# Patient Record
Sex: Male | Born: 1975 | Race: Black or African American | Hispanic: No | Marital: Single | Smoking: Current some day smoker
Health system: Southern US, Community
[De-identification: ages and names within clinical notes are randomized; demographics above are authoritative.]

## PROBLEM LIST (undated history)

## (undated) DIAGNOSIS — K859 Acute pancreatitis without necrosis or infection, unspecified: Secondary | ICD-10-CM

---

## 2015-11-26 ENCOUNTER — Emergency Department: Payer: BLUE CROSS/BLUE SHIELD

## 2015-11-26 ENCOUNTER — Encounter: Payer: Self-pay | Admitting: Emergency Medicine

## 2015-11-26 ENCOUNTER — Emergency Department
Admission: EM | Admit: 2015-11-26 | Discharge: 2015-11-26 | Disposition: A | Discharge status: Home / Self Care | Payer: BLUE CROSS/BLUE SHIELD | Attending: Emergency Medicine | Admitting: Emergency Medicine

## 2015-11-26 DIAGNOSIS — K529 Noninfective gastroenteritis and colitis, unspecified: Secondary | ICD-10-CM

## 2015-11-26 DIAGNOSIS — F17200 Nicotine dependence, unspecified, uncomplicated: Secondary | ICD-10-CM | POA: Insufficient documentation

## 2015-11-26 HISTORY — DX: Acute pancreatitis without necrosis or infection, unspecified: K85.90

## 2015-11-26 LAB — CBC
HCT: 41.5 % (ref 40.0–52.0)
Hemoglobin: 14.3 g/dL (ref 13.0–18.0)
MCH: 33.4 pg (ref 26.0–34.0)
MCHC: 34.4 g/dL (ref 32.0–36.0)
MCV: 97.3 fL (ref 80.0–100.0)
PLATELETS: 223 10*3/uL (ref 150–440)
RBC: 4.27 MIL/uL — ABNORMAL LOW (ref 4.40–5.90)
RDW: 12.3 % (ref 11.5–14.5)
WBC: 13.8 10*3/uL — ABNORMAL HIGH (ref 3.8–10.6)

## 2015-11-26 LAB — BASIC METABOLIC PANEL
Anion gap: 5 (ref 5–15)
BUN: 12 mg/dL (ref 6–20)
CALCIUM: 9.4 mg/dL (ref 8.9–10.3)
CHLORIDE: 105 mmol/L (ref 101–111)
CO2: 29 mmol/L (ref 22–32)
CREATININE: 0.87 mg/dL (ref 0.61–1.24)
GFR calc non Af Amer: >60 mL/min (ref >60)
GLUCOSE: 108 mg/dL — AB (ref 65–99)
Potassium: 4.2 mmol/L (ref 3.5–5.1)
Sodium: 139 mmol/L (ref 135–145)

## 2015-11-26 LAB — LIPASE, BLOOD: Lipase: 28 U/L (ref 11–51)

## 2015-11-26 LAB — TROPONIN I: Troponin I: <0.03 ng/mL (ref <0.03)

## 2015-11-26 MED ORDER — ONDANSETRON HCL 4 MG/2ML IJ SOLN
4.0000 mg | Freq: Once | INTRAMUSCULAR | Status: AC
  Administered 2015-11-26: 4 mg via INTRAVENOUS
  Filled 2015-11-26: qty 2

## 2015-11-26 MED ORDER — IOPAMIDOL (ISOVUE-300) INJECTION 61%
30.0000 mL | Freq: Once | INTRAVENOUS | Status: AC | PRN
  Administered 2015-11-26: 30 mL via ORAL

## 2015-11-26 MED ORDER — MORPHINE SULFATE (PF) 2 MG/ML IV SOLN
4.0000 mg | Freq: Once | INTRAVENOUS | Status: AC
  Administered 2015-11-26: 4 mg via INTRAVENOUS
  Filled 2015-11-26: qty 2

## 2015-11-26 MED ORDER — IOPAMIDOL (ISOVUE-300) INJECTION 61%
100.0000 mL | Freq: Once | INTRAVENOUS | Status: AC | PRN
  Administered 2015-11-26: 100 mL via INTRAVENOUS

## 2015-11-26 NOTE — ED Provider Notes (Addendum)
At shift change, I accepted care from Dr. Manson PasseyBrown. Patient was symptoms of enteritis, with an episode of chest discomfort which was felt to be very atypical with reassuring troponin initially, and pending repeat troponin  Repeat troponin is negative, less than 0.03.  Patient will be discharged with Dr. Theora GianottiBrown's prepared discharge instructions.    Governor Rooksebecca Suhail Peloquin, MD 11/26/15 0831  Addended to include, I reprinted patient's discharge instructions to include primary care follow-up.   Governor Rooksebecca Keyla Milone, MD 11/26/15 [phone]

## 2015-11-26 NOTE — ED Triage Notes (Signed)
Patient states that he started having central chest pain yesterday. Patient states he has some shortness of breath and diarrhea that started today. Patient states that about a month ago he was told he had pancreatitis and the pain is the same.

## 2015-11-26 NOTE — ED Notes (Signed)

## 2015-11-26 NOTE — ED Provider Notes (Signed)
[facility] Emergency Department Provider Note   I have reviewed the triage vital signs and the nursing notes.   HISTORY  Chief Complaint Chest Pain; Shortness of Breath; and Diarrhea   HPI Cody Bryant is a 40 y.o. male with recent history of hepatitis presents to the emergency department with abdominal discomfort with radiation to chest times one day accompanied by diarrhea which started today. Patient states his current pain score is 8 out of 10.    Past Medical History:  Diagnosis Date  . Pancreatitis     There are no active problems to display for this patient.   Past surgical history None  Prior to Admission medications   Not on File    Allergies No known drug allergies History reviewed. No pertinent family history.  Social History Social History  Substance Use Topics  . Smoking status: Current Some Day Smoker  . Smokeless tobacco: Never Used  . Alcohol use Yes     Comment: occ    Review of Systems Constitutional: No fever/chills Eyes: No visual changes. ENT: No sore throat. Cardiovascular: Denies chest pain. Respiratory: Denies shortness of breath. Gastrointestinal: Positive for abdominal pain and diarrhea  Genitourinary: Negative for dysuria. Musculoskeletal: Negative for back pain. Skin: Negative for rash. Neurological: Negative for headaches, focal weakness or numbness.  10-point ROS otherwise negative.  ____________________________________________   PHYSICAL EXAM:  VITAL SIGNS: ED Triage Vitals  Enc Vitals Group     BP 11/26/15 0020 123/77     Pulse Rate 11/26/15 0020 74     Resp 11/26/15 0020 18     Temp 11/26/15 0018 98.3 F (36.8 C)     Temp Source 11/26/15 0018 Oral     SpO2 11/26/15 0020 100 %     Weight 11/26/15 0019 200 lb (90.7 kg)     Height 11/26/15 0019 6' (1.829 m)     Head Circumference --      Peak Flow --      Pain Score 11/26/15 0019 7     Pain Loc --      Pain Edu? --      Excl. in GC?  --     Constitutional: Alert and oriented. Well appearing and in no acute distress. Eyes: Conjunctivae are normal. PERRL. EOMI. Head: Atraumatic. Mouth/Throat: Mucous membranes are moist.  Oropharynx non-erythematous. Cardiovascular: Normal rate, regular rhythm. Good peripheral circulation. Grossly normal heart sounds. Respiratory: Normal respiratory effort.  No retractions. Lungs CTAB. Gastrointestinal: Left lower quadrant left upper quadrant epigastric pain with palpation. No distention.  Musculoskeletal: No lower extremity tenderness nor edema. No gross deformities of extremities. Neurologic:  Normal speech and language. No gross focal neurologic deficits are appreciated.  Skin:  Skin is warm, dry and intact. No rash noted. Psychiatric: Mood and affect are normal. Speech and behavior are normal.  ____________________________________________   LABS (all labs ordered are listed, but only abnormal results are displayed)  Labs Reviewed  BASIC METABOLIC PANEL - Abnormal; Notable for the following:       Result Value   Glucose, Bld 108 (*)    All other components within normal limits  CBC - Abnormal; Notable for the following:    WBC 13.8 (*)    RBC 4.27 (*)    All other components within normal limits  TROPONIN I  LIPASE, BLOOD   ____________________________________________  EKG  ED ECG REPORT I, Loretto N Siler Mavis, the attending physician, personally viewed and interpreted this ECG.   Date: 11/26/2015  EKG Time: 12:15 AM  Rate: 71  Rhythm: Normal sinus rhythm  Axis: Normal  Intervals: Normal  ST&T Change: None  ____________________________________________  RADIOLOGY I, Ryder N Kainat Pizana, personally viewed and evaluated these images (plain radiographs) as part of my medical decision making, as well as reviewing the written report by the radiologist.  Dg Chest 2 View  Result Date: 11/26/2015 CLINICAL DATA:  Central chest pain beginning yesterday. EXAM: CHEST  2 VIEW  COMPARISON:  None. FINDINGS: The heart size and mediastinal contours are within normal limits. Both lungs are clear. The visualized skeletal structures are unremarkable. IMPRESSION: No active cardiopulmonary disease. Electronically Signed   By: Gerome Samavid  Williams III M.D   On: 11/26/2015 00:48   Ct Abdomen Pelvis W Contrast  Result Date: 11/26/2015 CLINICAL DATA:  89-year-old male with abdominal pain.  Diarrhea. EXAM: CT ABDOMEN AND PELVIS WITH CONTRAST TECHNIQUE: Multidetector CT imaging of the abdomen and pelvis was performed using the standard protocol following bolus administration of intravenous contrast. CONTRAST:  100mL ISOVUE-300 IOPAMIDOL (ISOVUE-300) INJECTION 61% COMPARISON:  None. FINDINGS: Lower chest: The visualized lung bases are clear. No intra-abdominal free air or free fluid. Hepatobiliary: Diffuse fatty infiltration of the liver. No intrahepatic biliary ductal dilatation. The gallbladder is unremarkable. Pancreas: Unremarkable. No pancreatic ductal dilatation or surrounding inflammatory changes. Spleen: Normal in size without focal abnormality. Adrenals/Urinary Tract: Adrenal glands are unremarkable. Kidneys are normal, without renal calculi, focal lesion, or hydronephrosis. Bladder is unremarkable. Stomach/Bowel: There is mild thickened appearance of jejunal folds in the left upper abdomen likely representing enteritis. There is no evidence of bowel obstruction. Thickened appearance of the distal colon and rectosigmoid likely related to underdistention and less likely colitis. Normal appendix. Vascular/Lymphatic: No significant vascular findings are present. No enlarged abdominal or pelvic lymph nodes. Reproductive: The prostate and seminal vesicles are grossly unremarkable. Other: Small fat containing umbilical hernia. Musculoskeletal: Mild degenerative changes primarily at L5-S1. The osseous structures are otherwise intact. IMPRESSION: Thickened appearance of the jejunal folds most consistent  with enteritis. Clinical correlation is recommended. No bowel obstruction. Normal appendix. Fatty liver. Electronically Signed   By: Elgie CollardArash  Radparvar M.D.   On: 11/26/2015 05:36    :   Procedures      INITIAL IMPRESSION / ASSESSMENT AND PLAN / ED COURSE  Pertinent labs & imaging results that were available during my care of the patient were reviewed by me and considered in my medical decision making (see chart for details).    Clinical Course    ____________________________________________  FINAL CLINICAL IMPRESSION(S) / ED DIAGNOSES  Final diagnoses:  Enteritis     MEDICATIONS GIVEN DURING THIS VISIT:  Medications  morphine 2 MG/ML injection 4 mg (4 mg Intravenous Given 11/26/15 0423)  ondansetron (ZOFRAN) injection 4 mg (4 mg Intravenous Given 11/26/15 0423)  iopamidol (ISOVUE-300) 61 % injection 30 mL (30 mLs Oral Contrast Given 11/26/15 0417)  iopamidol (ISOVUE-300) 61 % injection 100 mL (100 mLs Intravenous Contrast Given 11/26/15 0448)     NEW OUTPATIENT MEDICATIONS STARTED DURING THIS VISIT:  New Prescriptions   No medications on file    Modified Medications   No medications on file    Discontinued Medications   No medications on file     Note:  This document was prepared using Dragon voice recognition software and may include unintentional dictation errors.    Darci Currentandolph N Tome Wilson, MD 11/26/15 [phone]

## 2015-11-26 NOTE — ED Notes (Signed)
Dr. Brown at bedside

## 2015-11-26 NOTE — Discharge Instructions (Signed)
From Dr. Shaune PollackLord:   Return to the emergency department for any worsening symptoms including worsening chest pain, abdominal pain, fever, black or bloody stools, dizziness or passing out, or any other symptoms concerning to you.  For diarrhea you may try over-the-counter Imodium.

## 2016-01-07 ENCOUNTER — Emergency Department: Payer: Worker's Compensation

## 2016-01-07 ENCOUNTER — Emergency Department
Admission: EM | Admit: 2016-01-07 | Discharge: 2016-01-07 | Disposition: A | Discharge status: Home / Self Care | Payer: Worker's Compensation | Attending: Emergency Medicine | Admitting: Emergency Medicine

## 2016-01-07 DIAGNOSIS — Y93F2 Activity, caregiving, lifting: Secondary | ICD-10-CM | POA: Insufficient documentation

## 2016-01-07 DIAGNOSIS — S76212A Strain of adductor muscle, fascia and tendon of left thigh, initial encounter: Secondary | ICD-10-CM

## 2016-01-07 DIAGNOSIS — Y929 Unspecified place or not applicable: Secondary | ICD-10-CM | POA: Insufficient documentation

## 2016-01-07 DIAGNOSIS — X500XXA Overexertion from strenuous movement or load, initial encounter: Secondary | ICD-10-CM | POA: Insufficient documentation

## 2016-01-07 DIAGNOSIS — R1032 Left lower quadrant pain: Secondary | ICD-10-CM

## 2016-01-07 DIAGNOSIS — S3991XA Unspecified injury of abdomen, initial encounter: Secondary | ICD-10-CM | POA: Diagnosis present

## 2016-01-07 DIAGNOSIS — F17200 Nicotine dependence, unspecified, uncomplicated: Secondary | ICD-10-CM | POA: Insufficient documentation

## 2016-01-07 DIAGNOSIS — Y990 Civilian activity done for income or pay: Secondary | ICD-10-CM | POA: Insufficient documentation

## 2016-01-07 MED ORDER — NAPROXEN 500 MG PO TABS: 500.0000 mg | ORAL_TABLET | Freq: Two times a day (BID) | ORAL | 2 refills | Status: AC

## 2016-01-07 MED ORDER — NAPROXEN 500 MG PO TABS
500.0000 mg | ORAL_TABLET | Freq: Once | ORAL | Status: AC
  Administered 2016-01-07: 500 mg via ORAL
  Filled 2016-01-07: qty 1

## 2016-01-07 NOTE — ED Provider Notes (Signed)
[facility] Emergency Department Provider Note   ____________________________________________    I have reviewed the triage vital signs and the nursing notes.   HISTORY  Chief Complaint Groin Pain     HPI Cody Bryant is a 40 y.o. male who presents with complaints of left groin pain. Patient reports he was lifting a heavy object at work and felt a popping in his left groin. He complains of worsening pain when standing and pain in his scrotum as well. He has never had this before. He has not taken anything for it. No abdominal pain. No history of hernia   Past Medical History:  Diagnosis Date  . Pancreatitis     There are no active problems to display for this patient.   History reviewed. No pertinent surgical history.  Prior to Admission medications   Not on File     Allergies Patient has no known allergies.  No family history on file.  Social History Social History  Substance Use Topics  . Smoking status: Current Some Day Smoker  . Smokeless tobacco: Never Used  . Alcohol use Yes     Comment: occ    Review of Systems   Gastrointestinal: No abdominal pain.  No nausea, no vomiting.   Genitourinary: Negative for dysuria.Scrotal discomfort on the left Musculoskeletal: Negative for back pain. Groin pain as above Skin: Negative for rash. Neurological: Negative for weakness    ____________________________________________   PHYSICAL EXAM:  VITAL SIGNS: ED Triage Vitals  Enc Vitals Group     BP 01/07/16 0413 136/88     Pulse Rate 01/07/16 0413 87     Resp 01/07/16 0413 18     Temp 01/07/16 0413 98.3 F (36.8 C)     Temp Source 01/07/16 0413 Oral     SpO2 01/07/16 0413 97 %     Weight 01/07/16 0407 225 lb (102.1 kg)     Height 01/07/16 0407 6' (1.829 m)     Head Circumference --      Peak Flow --      Pain Score 01/07/16 0408 7     Pain Loc --      Pain Edu? --      Excl. in GC? --     Constitutional: Alert and  oriented. No acute distress. Pleasant and interactive Eyes: Conjunctivae are normal.  c. Nose: No congestion/rhinnorhea. Mouth/Throat: Mucous membranes are moist.    Cardiovascular: Normal rate, regular rhythm.  Good peripheral circulation. Respiratory: Normal respiratory effort.  No retractions.  Gastrointestinal: Soft and nontender. No distention.  No CVA tenderness. Genitourinary: Scrotum appears normal, however the patient does have tenderness particularly on the inferior aspect of the testicle. No hernia felt, patient also has tenderness to palpation along the adductor groin muscles Musculoskeletal: No lower extremity tenderness nor edema.  Warm and well perfused Neurologic:  Normal speech and language. No gross focal neurologic deficits are appreciated.  Skin:  Skin is warm, dry and intact. No rash noted. Psychiatric: Mood and affect are normal. Speech and behavior are normal.  ____________________________________________   LABS (all labs ordered are listed, but only abnormal results are displayed)  Labs Reviewed - No data to display ____________________________________________  EKG  None ____________________________________________  RADIOLOGY  Ultrasound pending ____________________________________________   PROCEDURES  Procedure(s) performed: No    Critical Care performed: No ____________________________________________   INITIAL IMPRESSION / ASSESSMENT AND PLAN / ED COURSE  Pertinent labs & imaging results that were available during my care of  the patient were reviewed by me and considered in my medical decision making (see chart for details).  Suspect musculoskeletal injury as the cause of this pain, no hernia felt on thorough exam. We'll obtain ultrasound of the scrotum and reevaluate  Clinical Course   Ultrasound no acute distress. Patient is feeling better. We will treat with NSAIDs for suspected adductor muscle strain, recommend PCP follow-up. Return  precautions discussed ____________________________________________   FINAL CLINICAL IMPRESSION(S) / ED DIAGNOSES  Final diagnoses:  Groin pain, left      NEW MEDICATIONS STARTED DURING THIS VISIT:  New Prescriptions   No medications on file     Note:  This document was prepared using Dragon voice recognition software and may include unintentional dictation errors.    Jene Everyobert Maclean Foister, MD 01/07/16 [phone]

## 2016-01-07 NOTE — ED Notes (Signed)
Pt returned from ultrasound

## 2016-01-07 NOTE — ED Triage Notes (Signed)
Patient was at work, lifted a heavy piece of equipment and felt something pop. Now with swelling in the left groin.

## 2016-01-07 NOTE — ED Notes (Signed)
Pt's supervisor, Marcelle Smilinganny Moore,  arrived to ED with pt; he says pt needs a UDS and breathalyzer test for worker's comp

## 2016-01-09 ENCOUNTER — Ambulatory Visit
Admission: EM | Admit: 2016-01-09 | Discharge: 2016-01-09 | Disposition: A | Discharge status: Home / Self Care | Payer: Worker's Compensation | Attending: Family Medicine | Admitting: Family Medicine

## 2016-01-09 ENCOUNTER — Encounter: Payer: Self-pay | Admitting: Emergency Medicine

## 2016-01-09 DIAGNOSIS — S76212A Strain of adductor muscle, fascia and tendon of left thigh, initial encounter: Secondary | ICD-10-CM | POA: Diagnosis not present

## 2016-01-09 MED ORDER — CYCLOBENZAPRINE HCL 10 MG PO TABS: 10.0000 mg | ORAL_TABLET | Freq: Three times a day (TID) | ORAL | 0 refills | Status: AC | PRN

## 2016-01-09 NOTE — ED Triage Notes (Signed)
Patient here for follow-up visit for recent work related injury.  Patient was seen at [facility] ER 12/10.  Patient reports ongoing swelling and pain on the left side of his groin.

## 2016-01-09 NOTE — ED Provider Notes (Signed)
CSN: [id number]     Arrival date & time 01/09/16  1105 History   First MD Initiated Contact with Patient 01/09/16 1324     Chief Complaint  Patient presents with  . Worker's Comp Follow-up   (Consider location/radiation/quality/duration/timing/severity/associated sxs/prior Treatment) 40 year old male presents for follow-up to a worker's comp injury that occurred on 01/06/16. He was at work and lifting a barrel with another co-worker when he felt a "pop" in his left groin area. He continued to work but starting having increased pain. He reported the incident to his supervisor who took him to [facility] ER early in the AM on 01/07/16. They performed an ultrasound which was negative (no hernia) except for a varicocele. Was given Naproxen which has not helped much with pain and swelling. Was suppose to return to work today but having more pain and swelling in the area. He is concerned that additional injury has occurred. No history of chronic health issues. Takes no other daily medication.     The history is provided by the patient.    Past Medical History:  Diagnosis Date  . Pancreatitis    History reviewed. No pertinent surgical history. History reviewed. No pertinent family history. Social History  Substance Use Topics  . Smoking status: Current Some Day Smoker  . Smokeless tobacco: Never Used  . Alcohol use Yes     Comment: occ    Review of Systems  Constitutional: Negative for chills, fatigue and fever.  Cardiovascular: Negative for chest pain.  Gastrointestinal: Negative for abdominal pain, constipation, diarrhea, nausea and vomiting.  Genitourinary: Negative for difficulty urinating, discharge, dysuria, flank pain, hematuria, penile pain, penile swelling and testicular pain. Scrotal swelling: left sided groin swelling and pain.  Musculoskeletal: Positive for myalgias. Negative for arthralgias, back pain and joint swelling.  Skin: Negative for rash and wound.  Neurological: Negative  for weakness, numbness and headaches.  Hematological: Negative for adenopathy.    Allergies  Patient has no known allergies.  Home Medications   Prior to Admission medications   Medication Sig Start Date End Date Taking? Authorizing Provider  cyclobenzaprine (FLEXERIL) 10 MG tablet Take 1 tablet (10 mg total) by mouth 3 (three) times daily as needed for muscle spasms. 01/09/16   Sudie GrumblingAnn Berry Keron Koffman, NP  naproxen (NAPROSYN) 500 MG tablet Take 1 tablet (500 mg total) by mouth 2 (two) times daily with a meal. 01/07/16   Jene Everyobert Kinner, MD   Meds Ordered and Administered this Visit  Medications - No data to display  BP 138/86 (BP Location: Right Arm)   Pulse 73   Temp 98.6 F (37 C) (Oral)   Resp 17   Ht 6' (1.829 m)   Wt 210 lb (95.3 kg)   SpO2 100%   BMI 28.48 kg/m  No data found.   Physical Exam  Constitutional: He is oriented to person, place, and time. He appears well-developed and well-nourished. No distress.  Cardiovascular: Normal rate, regular rhythm, normal heart sounds and intact distal pulses.   Abdominal: Soft. Normal appearance and bowel sounds are normal. He exhibits no distension. There is tenderness in the left lower quadrant. There is no rebound and no guarding. No hernia. Hernia confirmed negative in the left inguinal area.    Genitourinary: Left testis shows no mass and no tenderness.     Genitourinary Comments: Very tender along left inguinal area. Slight swelling present. No tenderness or swelling on right side. No hernia present. No erythema or rash. Pain increases with bending  leg/hip.   Musculoskeletal: Normal range of motion. He exhibits tenderness.       Left hip: He exhibits tenderness. He exhibits normal range of motion and no swelling.  Lymphadenopathy: No inguinal adenopathy noted on the left side.  Neurological: He is alert and oriented to person, place, and time. He has normal strength. No sensory deficit.  Skin: Skin is warm, dry and intact.  Capillary refill takes less than 2 seconds. No rash noted.  Psychiatric: He has a normal mood and affect. His speech is normal and behavior is normal. Judgment and thought content normal.    Urgent Care Course   Clinical Course     Procedures (including critical care time)  Labs Review Labs Reviewed - No data to display  Imaging Review No results found.   Visual Acuity Review  Right Eye Distance:   Left Eye Distance:   Bilateral Distance:    Right Eye Near:   Left Eye Near:    Bilateral Near:         MDM   1. Groin strain, left, initial encounter    Discussed with patient that he does not have a hernia. Reviewed that he probably has a significant groin strain that will take time to heal. Recommend continue Naproxen 500mg  twice a day as directed. May try Flexeril 10mg  1/2 to 1 tablet 3 times a day as needed for muscle pain/spasms. Will cause drowsiness. May apply warm moist heat to area for comfort. Note written for work for today and tomorrow. No lifting >25 lbs for at least 3 days until cleared by the Occupational Health Provider. Discussed that he should call Tommie Maximiano CossAnne Moore, NP to schedule follow-up appointment for further evaluation and for any additional restrictions and/or notes for work. Patient understands and will call today to make an appointment.     Sudie GrumblingAnn Berry Daziyah Cogan, NP 01/09/16 [phone]

## 2016-01-09 NOTE — Discharge Instructions (Signed)
Recommend continue Naproxen twice a day as needed for pain and swelling. May take Flexeril 10mg  1/2 to 1 tablet up to 3 times a day as needed for muscle pain. Apply warm compresses to area for comfort. Call to schedule follow-up with Occupational Health provider.

## 2018-08-19 IMAGING — CT CT ABD-PELV W/ CM
2 of 5 series · 16 of 46 positions shown, 18 images · IV contrast (APPLIED)
Comparison: None.

CLINICAL DATA: 4-year-old male with abdominal pain.  Diarrhea.

EXAM:
CT ABDOMEN AND PELVIS WITH CONTRAST
TECHNIQUE: Multidetector CT imaging of the abdomen and pelvis was performed
using the standard protocol following bolus administration of
intravenous contrast.
CONTRAST:  100mL DOYT9T-122 IOPAMIDOL (DOYT9T-122) INJECTION 61%

[Series 2: axial st · axial · 0.81mm/px · z∈[-501,-61]mm · 13 of 100 slices shown, 15 images]
[im 6/100  soft-tissue]
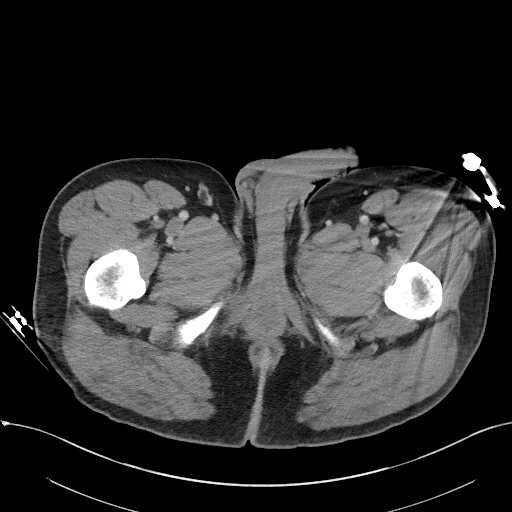
[im 6/100  bone]
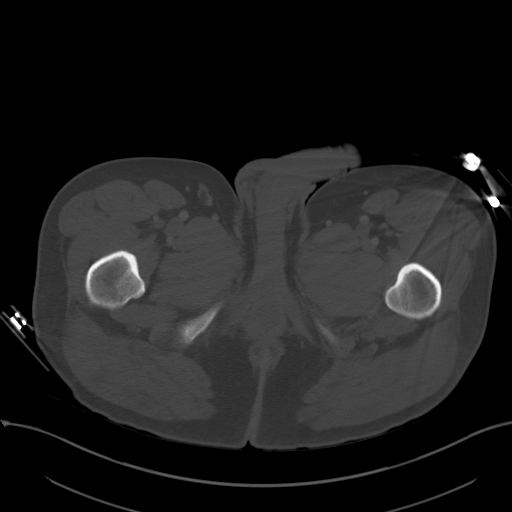
[im 12/100  soft-tissue]
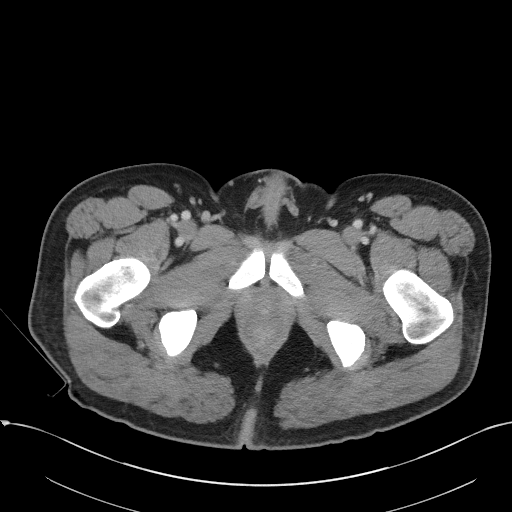
[im 23/100  soft-tissue]
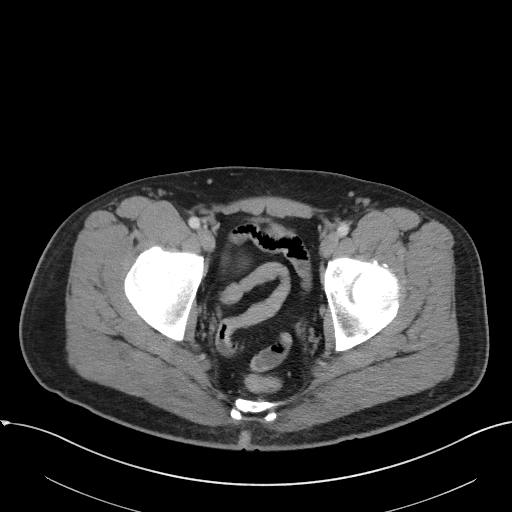
[im 28/100  soft-tissue]
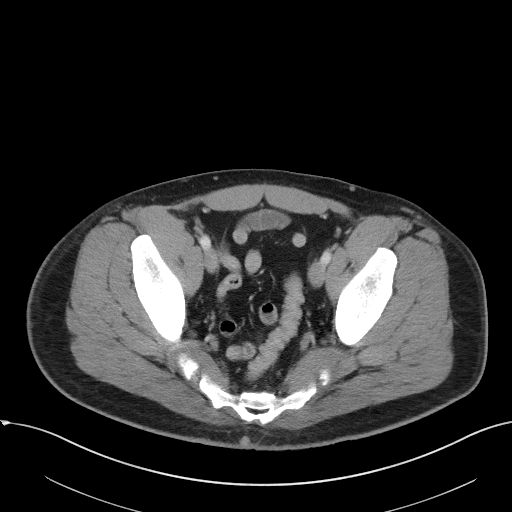
[im 34/100  soft-tissue]
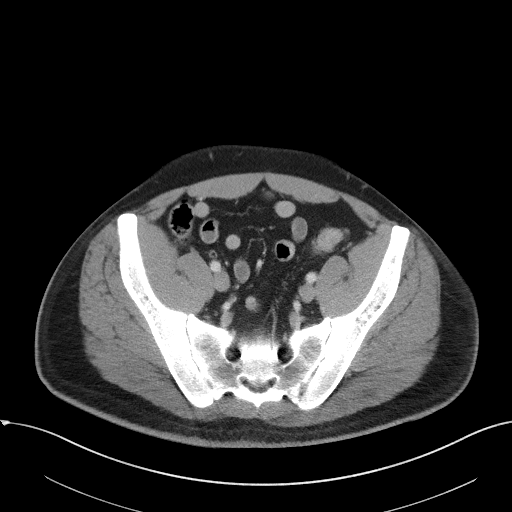
[im 45/100  soft-tissue]
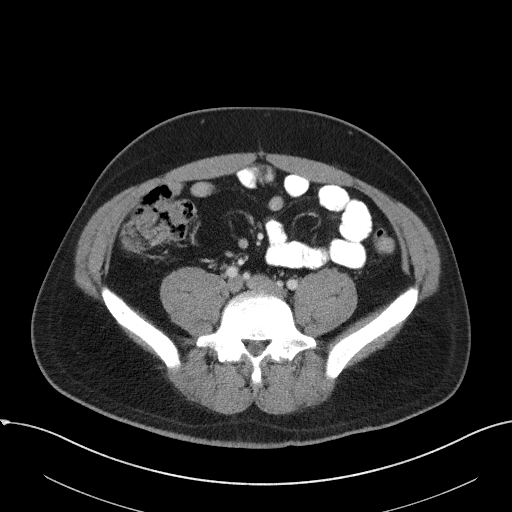
[im 50/100  soft-tissue]
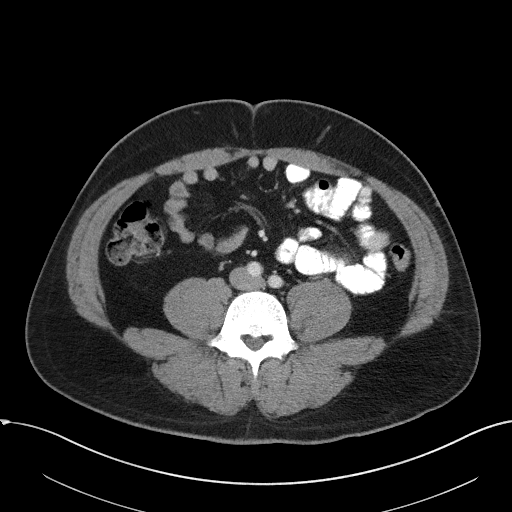
[im 56/100  soft-tissue]
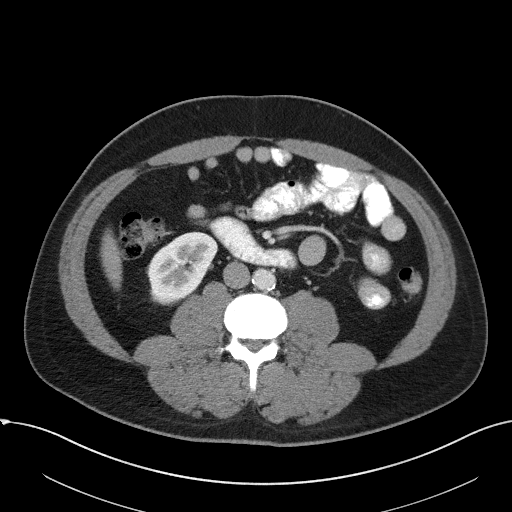
[im 67/100  soft-tissue]
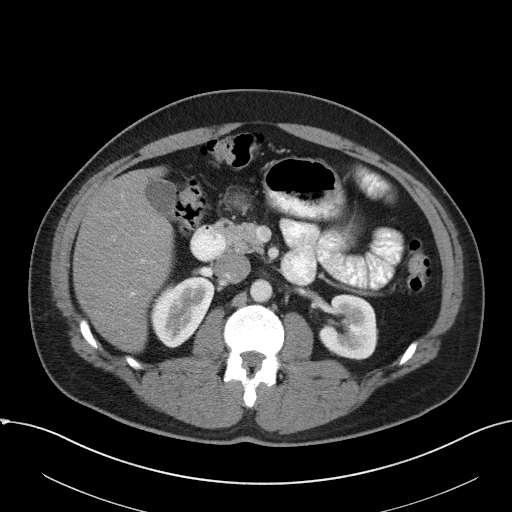
[im 67/100  bone]
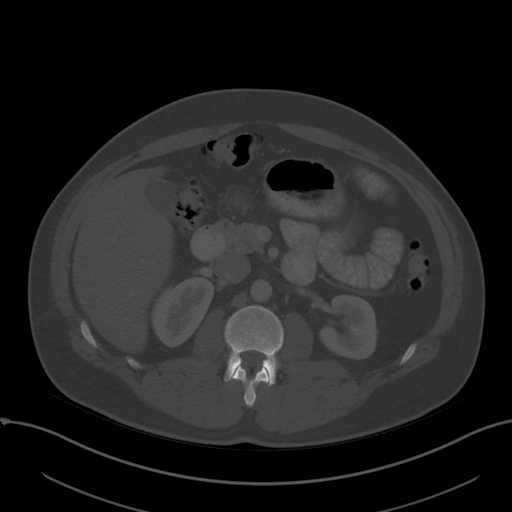
[im 72/100  soft-tissue]
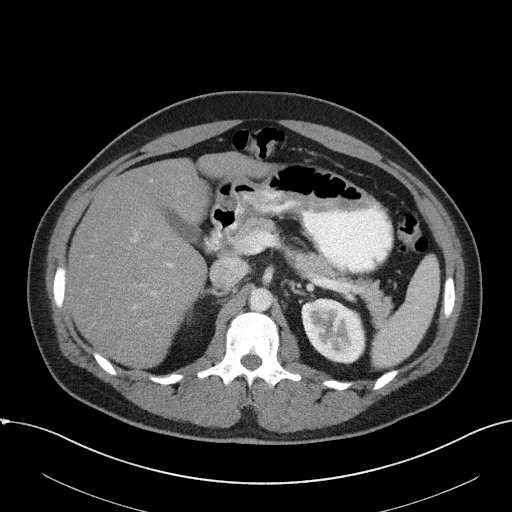
[im 78/100  soft-tissue]
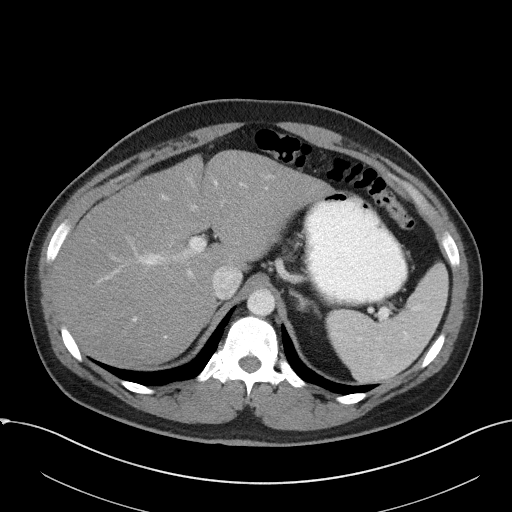
[im 89/100  soft-tissue]
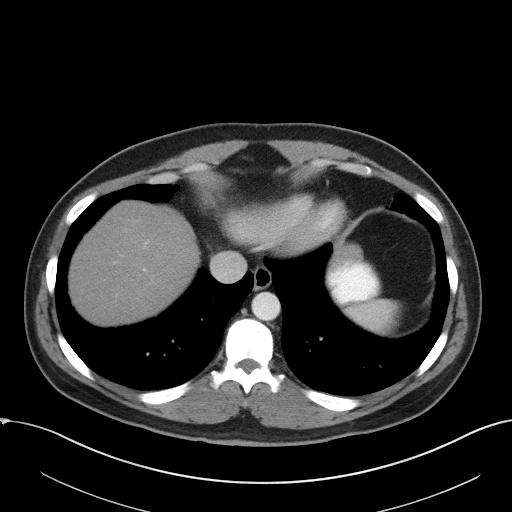
[im 94/100  soft-tissue]
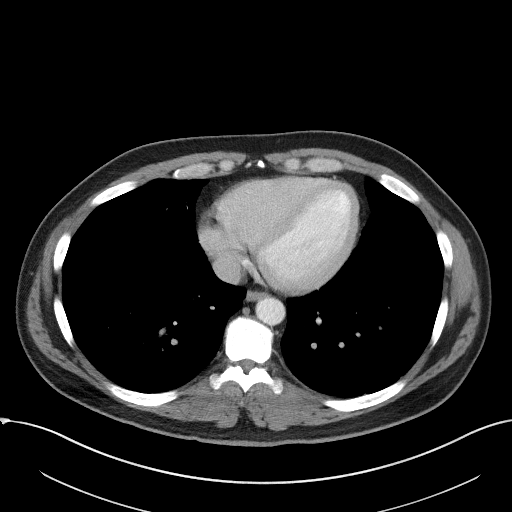

[Series 5: coronal st · coronal · 0.77mm/px · 3 of 91 slices shown]
[im 31/91  soft-tissue]
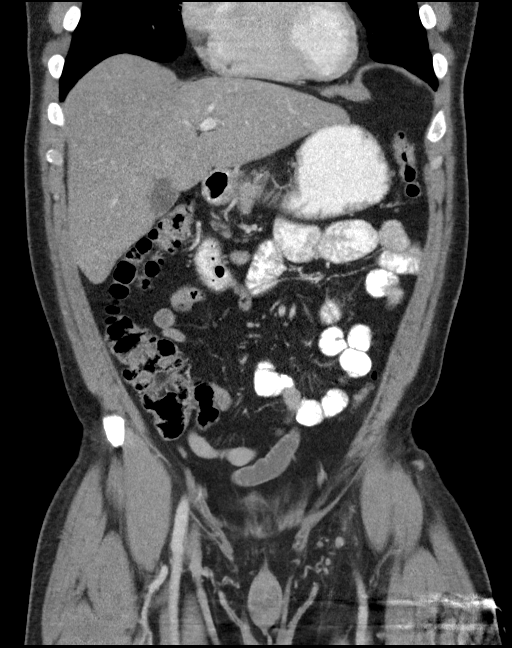
[im 41/91  soft-tissue]
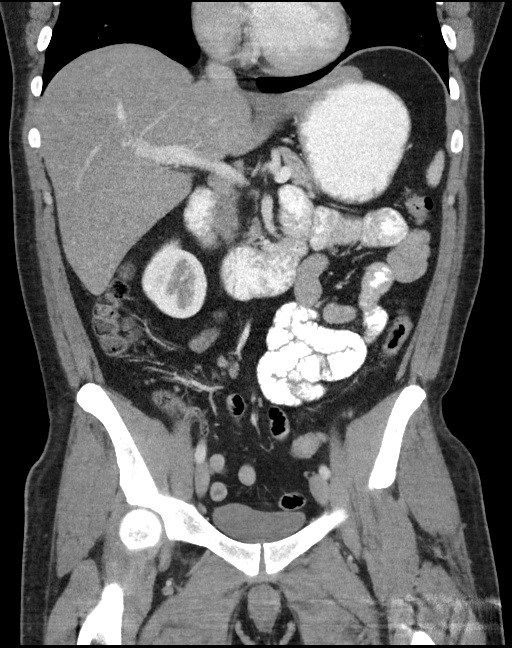
[im 51/91  soft-tissue]
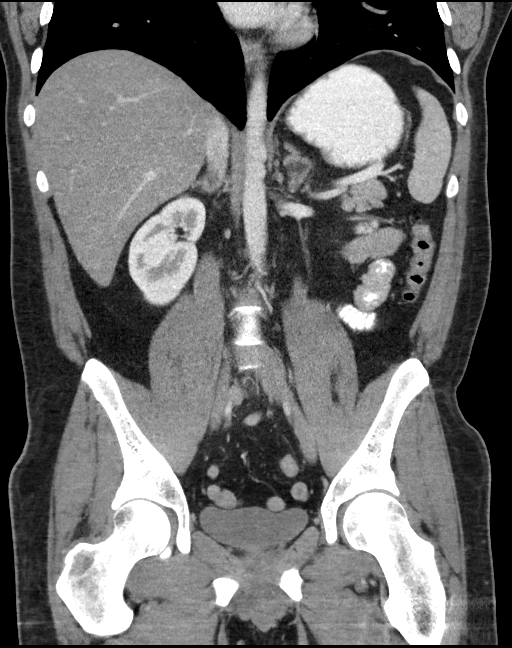

[16 of 46 positions shown; findings below may reference images not displayed]

FINDINGS: Lower chest: The visualized lung bases are clear.

No intra-abdominal free air or free fluid.

Hepatobiliary: Diffuse fatty infiltration of the liver. No
intrahepatic biliary ductal dilatation. The gallbladder is
unremarkable.

Pancreas: Unremarkable. No pancreatic ductal dilatation or
surrounding inflammatory changes.

Spleen: Normal in size without focal abnormality.

Adrenals/Urinary Tract: Adrenal glands are unremarkable. Kidneys are
normal, without renal calculi, focal lesion, or hydronephrosis.
Bladder is unremarkable.

Stomach/Bowel: There is mild thickened appearance of jejunal folds
in the left upper abdomen likely representing enteritis. There is no
evidence of bowel obstruction. Thickened appearance of the distal
colon and rectosigmoid likely related to underdistention and less
likely colitis. Normal appendix.

Vascular/Lymphatic: No significant vascular findings are present. No
enlarged abdominal or pelvic lymph nodes.

Reproductive: The prostate and seminal vesicles are grossly
unremarkable.

Other: Small fat containing umbilical hernia.

Musculoskeletal: Mild degenerative changes primarily at L5-S1. The
osseous structures are otherwise intact.
IMPRESSION: Thickened appearance of the jejunal folds most consistent with
enteritis. Clinical correlation is recommended. No bowel
obstruction. Normal appendix.

Fatty liver.

## 2018-08-19 IMAGING — CR DG CHEST 2V
1 series · 2 of 2 positions shown · non-contrast
Comparison: None.

CLINICAL DATA: Central chest pain beginning yesterday.

EXAM:
CHEST  2 VIEW

[Series 1: dg chest 2 view · 0.14mm/px · 2 of 2 slices shown]
[im 1/2]
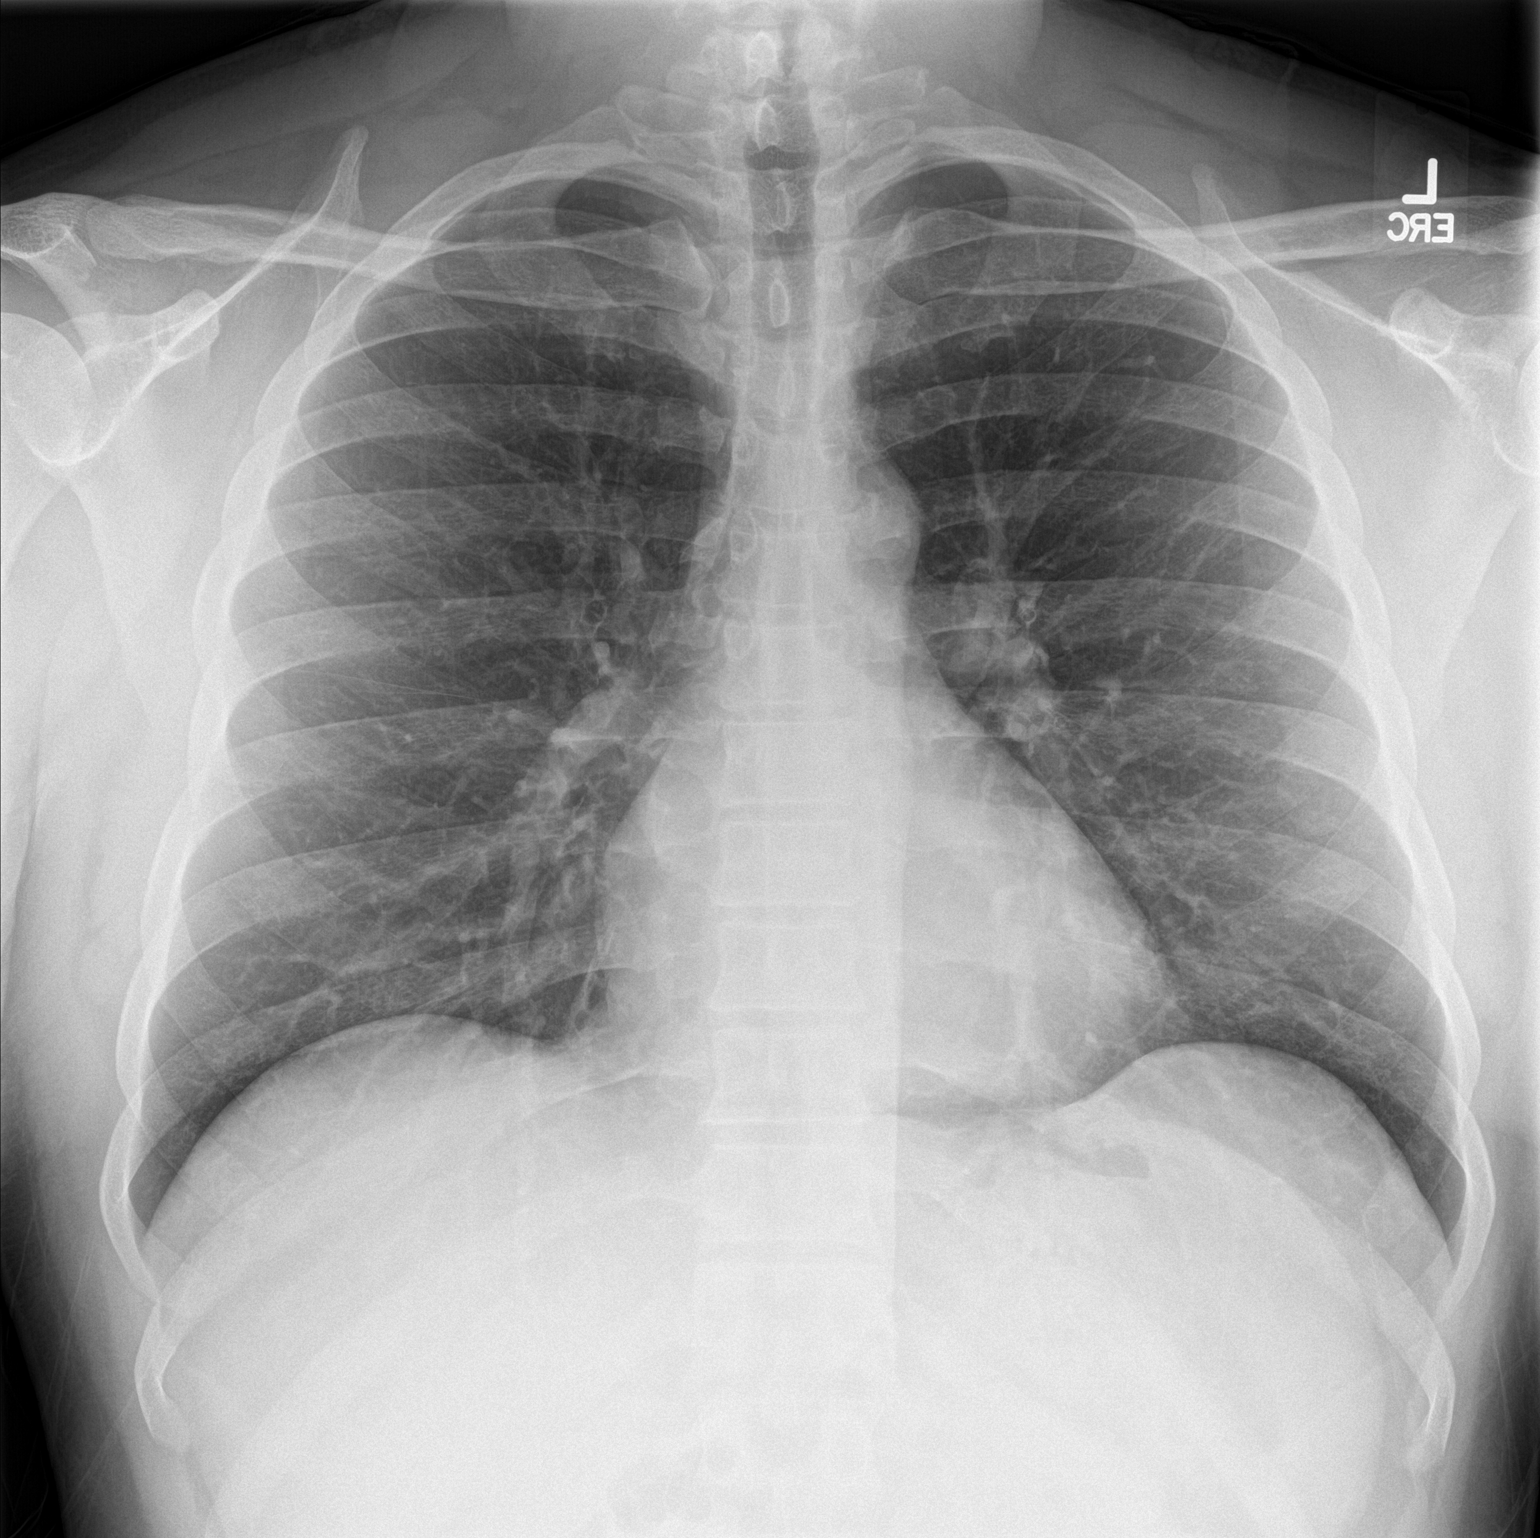
[im 2/2]
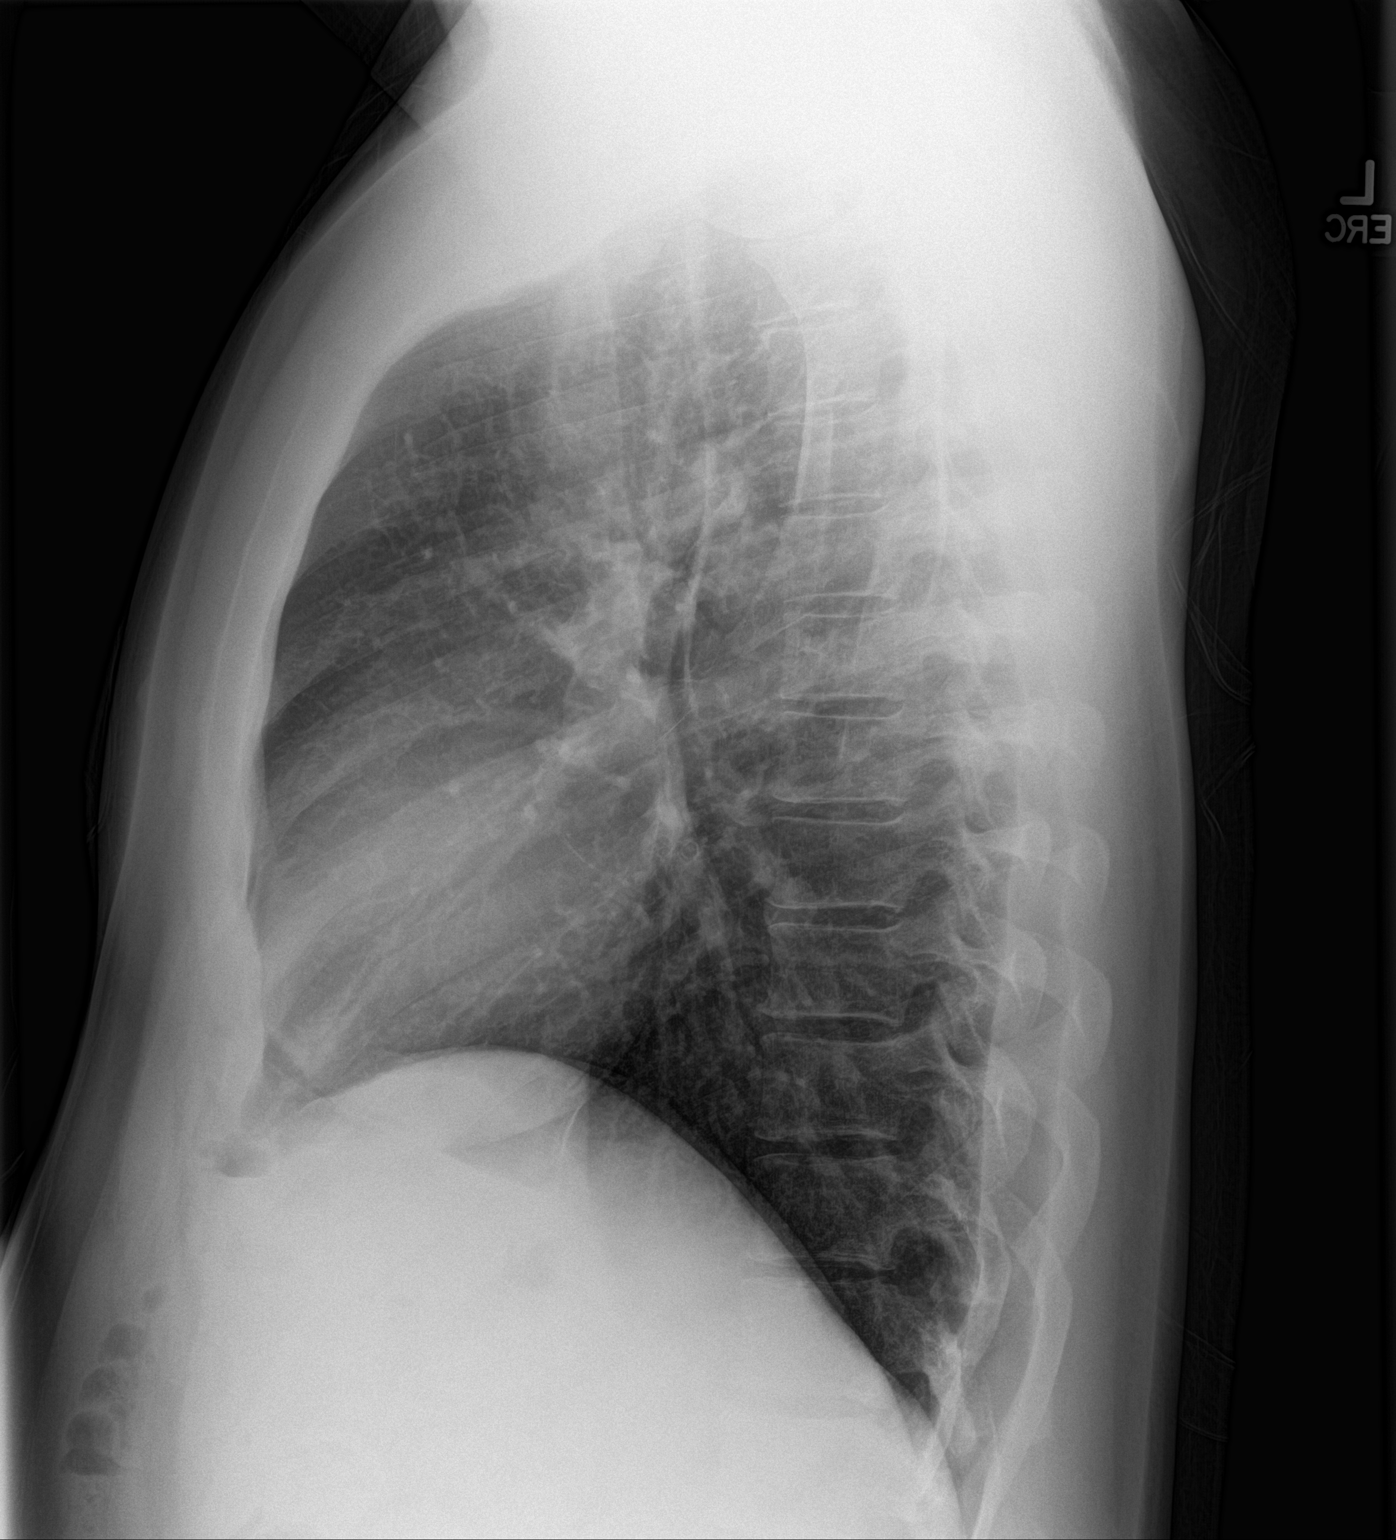

[2 of 2 positions shown; findings below may reference images not displayed]

FINDINGS: The heart size and mediastinal contours are within normal limits.
Both lungs are clear. The visualized skeletal structures are
unremarkable.
IMPRESSION: No active cardiopulmonary disease.

## 2018-11-18 IMAGING — US US SCROTUM
1 series · 14 of 25 positions shown · non-contrast
Comparison: None available.

CLINICAL DATA: Initial evaluation for acute right testicular
swelling for 6 hours.

EXAM:
ULTRASOUND OF SCROTUM
TECHNIQUE: Complete ultrasound examination of the testicles, epididymis, and
other scrotal structures was performed.

[Series 1: us scrotum · 0.08mm/px · 14 of 56 slices shown]
[im 1/56]
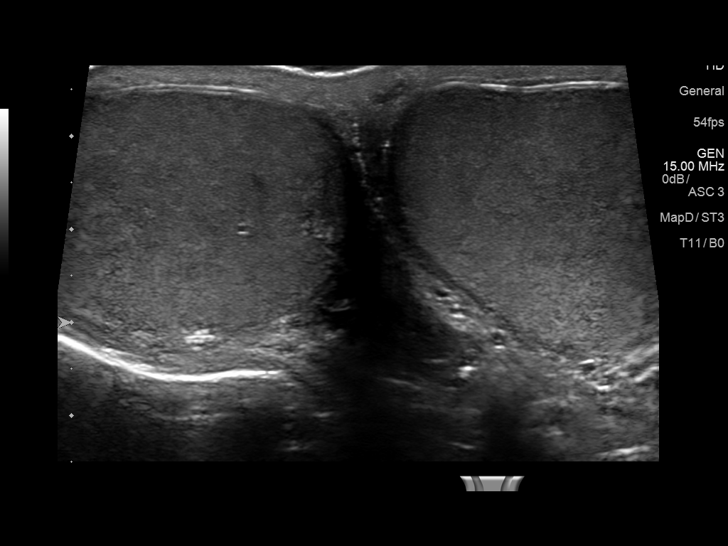
[im 5/56]
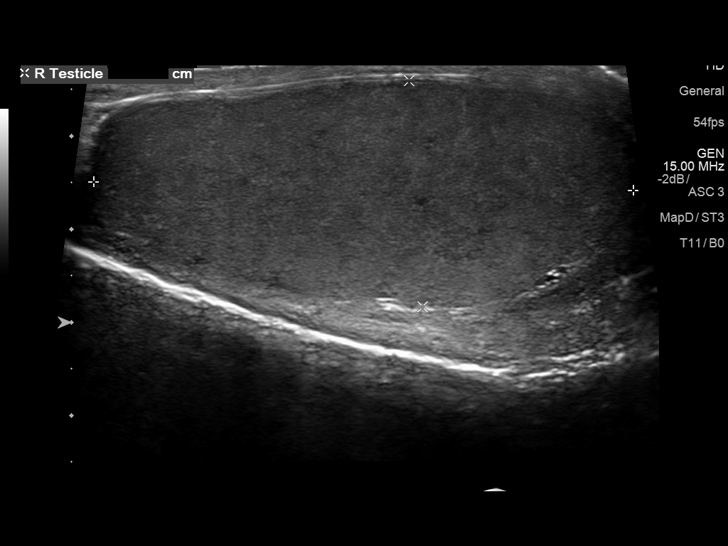
[im 10/56]
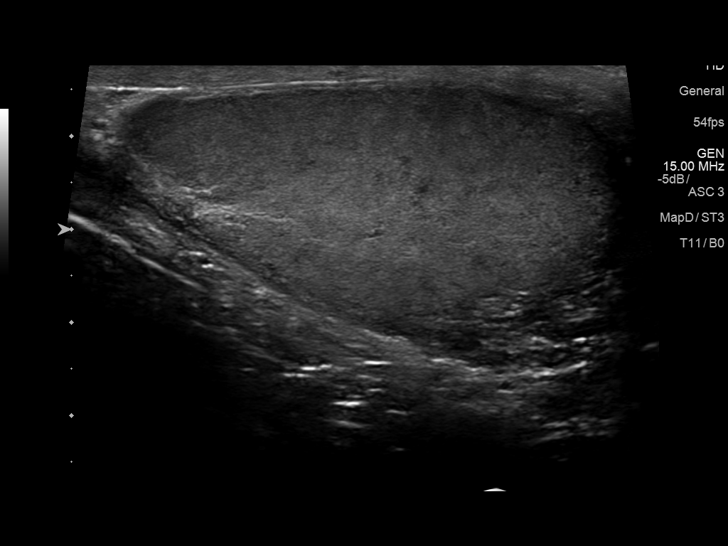
[im 14/56]
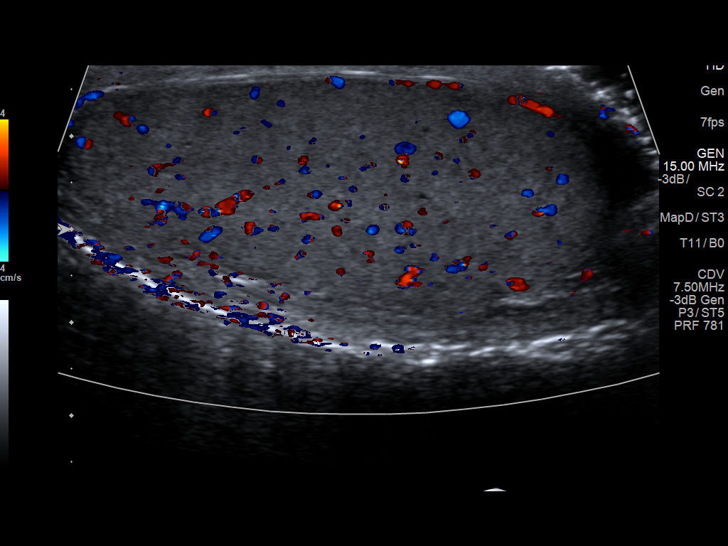
[im 19/56]
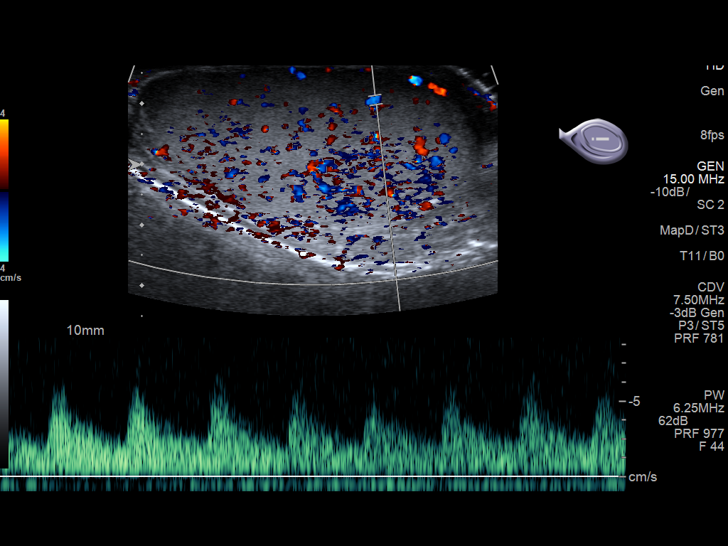
[im 21/56]
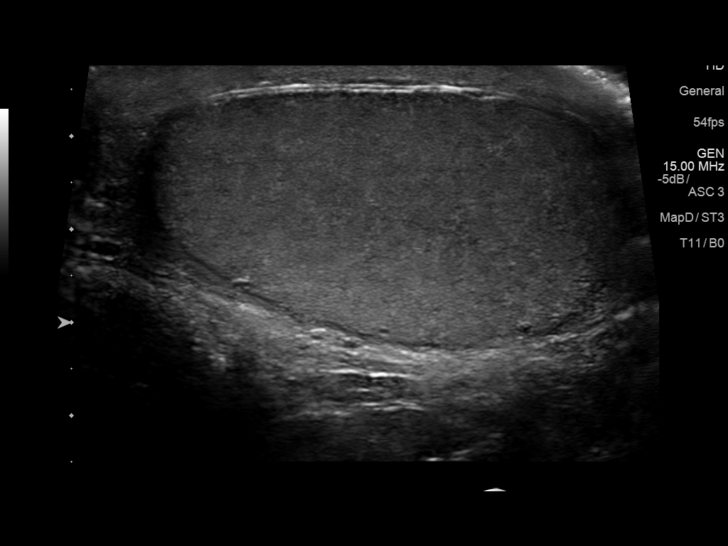
[im 26/56]
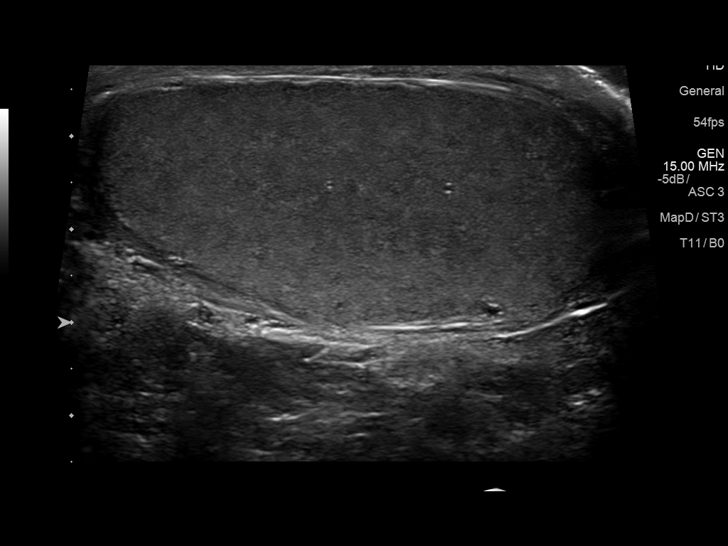
[im 30/56]
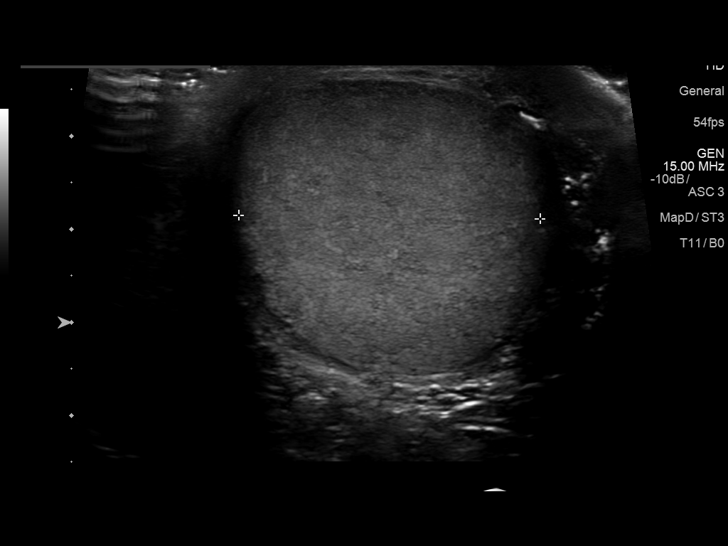
[im 35/56]
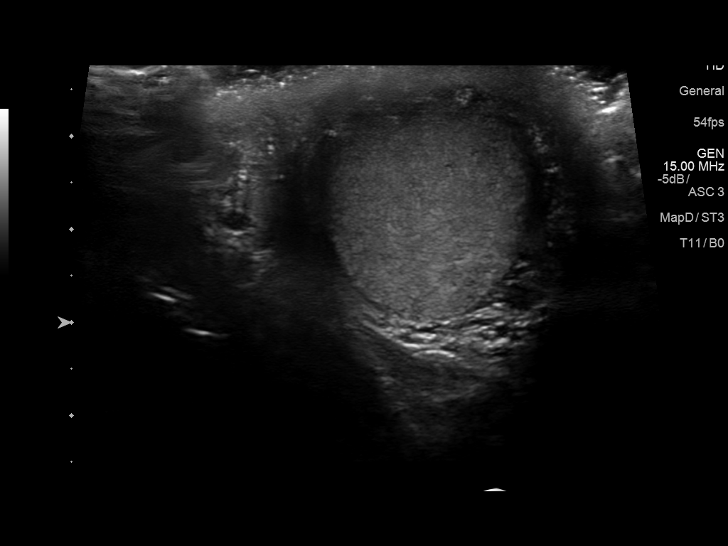
[im 37/56]
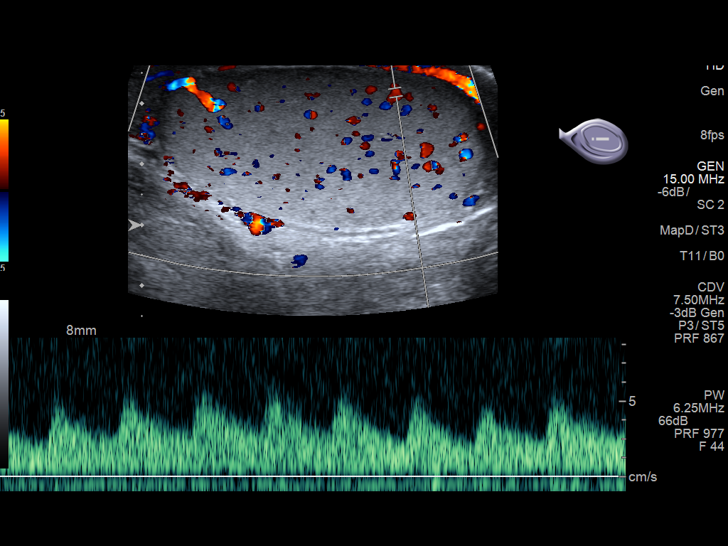
[im 42/56]
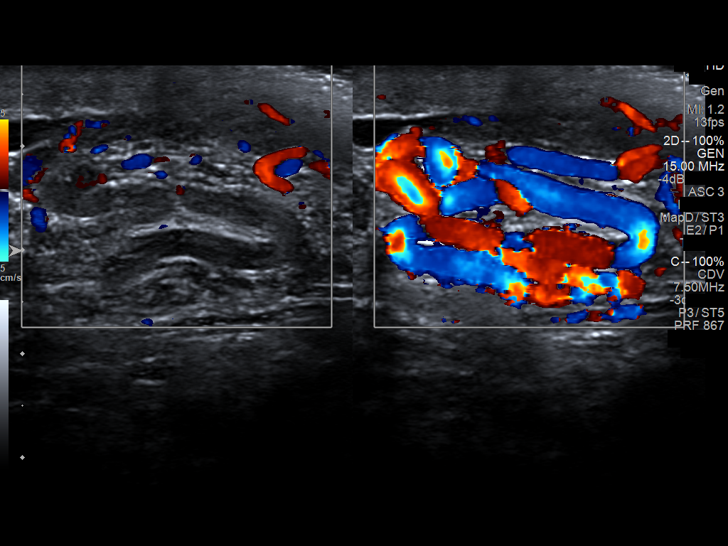
[im 46/56]
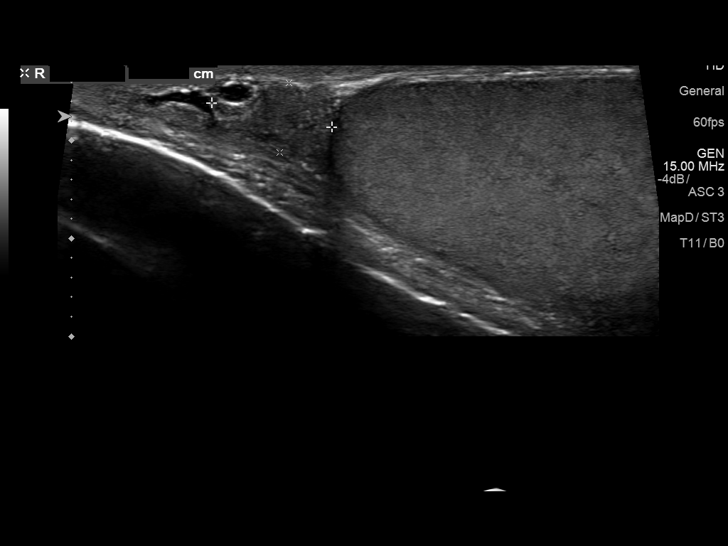
[im 51/56]
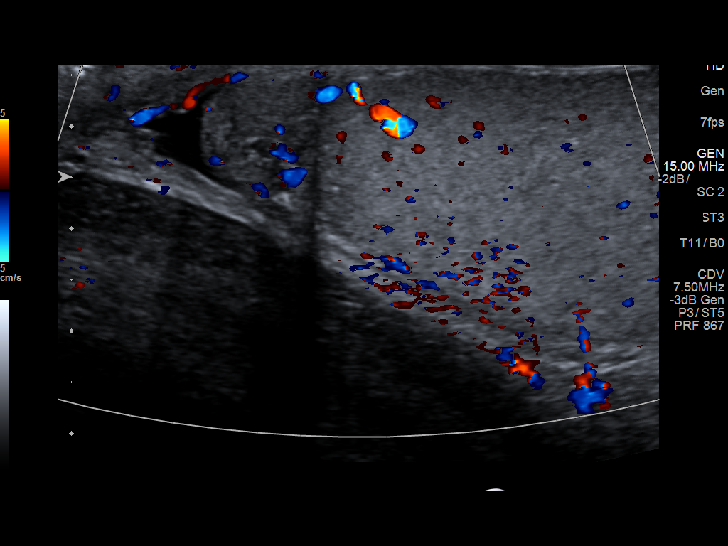
[im 56/56]
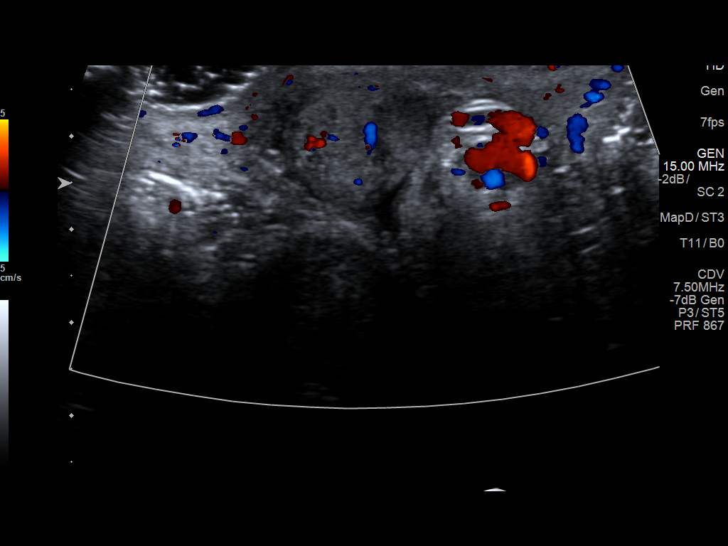

[14 of 25 positions shown; findings below may reference images not displayed]

FINDINGS: Right testicle

Measurements: 5.8 x 2.4 x 3.5 cm. No mass or microlithiasis
visualized.

Left testicle

Measurements: 5.6 x 2.7 x 3.2 cm. No mass or microlithiasis
visualized.

Right epididymis: Normal in size and appearance. Small epididymal
cyst measuring 3 x 2 x 3 mm noted.

Left epididymis:  Normal in size and appearance.

Hydrocele:  None visualized.

Varicocele:  Left-sided varicocele.
IMPRESSION: 1. Normal sonographic appearance of the testes. No evidence for
torsion or other acute abnormality.
2. Left-sided varicocele.
